# Patient Record
Sex: Male | Born: 2000 | Race: White | Hispanic: No | Marital: Single | Smoking: Never smoker
Health system: Southern US, Community
[De-identification: ages and names within clinical notes are randomized; demographics above are authoritative.]

---
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
## 2022-01-10 ENCOUNTER — Ambulatory Visit
Admission: RE | Admit: 2022-01-10 | Discharge: 2022-01-10 | Disposition: A | Discharge status: Home / Self Care | Payer: BC Managed Care – PPO | Source: Ambulatory Visit | Attending: Gastroenterology | Admitting: Gastroenterology

## 2022-01-10 ENCOUNTER — Ambulatory Visit: Payer: BC Managed Care – PPO | Admitting: Anesthesiology

## 2022-01-10 ENCOUNTER — Encounter: Payer: Self-pay | Admitting: Gastroenterology

## 2022-01-10 ENCOUNTER — Encounter
Admission: RE | Disposition: A | Discharge status: Home / Self Care | Payer: Self-pay | Source: Ambulatory Visit | Attending: Gastroenterology

## 2022-01-10 DIAGNOSIS — K900 Celiac disease: Secondary | ICD-10-CM | POA: Insufficient documentation

## 2022-01-10 DIAGNOSIS — Z8719 Personal history of other diseases of the digestive system: Secondary | ICD-10-CM

## 2022-01-10 HISTORY — PX: ESOPHAGOGASTRODUODENOSCOPY (EGD) WITH PROPOFOL: SHX5813

## 2022-01-10 SURGERY — ESOPHAGOGASTRODUODENOSCOPY (EGD) WITH PROPOFOL
Anesthesia: General

## 2022-01-10 MED ORDER — PROPOFOL 10 MG/ML IV BOLUS
INTRAVENOUS | Status: DC | PRN
Start: 2022-01-10 — End: 2022-01-10
  Administered 2022-01-10: 20 mg via INTRAVENOUS
  Administered 2022-01-10: 50 mg via INTRAVENOUS

## 2022-01-10 MED ORDER — MIDAZOLAM HCL 2 MG/2ML IJ SOLN
INTRAMUSCULAR | Status: DC | PRN
  Administered 2022-01-10: 2 mg via INTRAVENOUS

## 2022-01-10 MED ORDER — LIDOCAINE HCL (CARDIAC) PF 100 MG/5ML IV SOSY
PREFILLED_SYRINGE | INTRAVENOUS | Status: DC | PRN
Start: 2022-01-10 — End: 2022-01-10
  Administered 2022-01-10: 100 mg via INTRAVENOUS

## 2022-01-10 MED ORDER — MIDAZOLAM HCL 2 MG/2ML IJ SOLN
INTRAMUSCULAR | Status: AC
  Filled 2022-01-10: qty 2

## 2022-01-10 MED ORDER — SODIUM CHLORIDE 0.9 % IV SOLN
INTRAVENOUS | Status: DC
  Administered 2022-01-10: 20 mL/h via INTRAVENOUS

## 2022-01-10 MED ORDER — DEXMEDETOMIDINE HCL IN NACL 200 MCG/50ML IV SOLN
INTRAVENOUS | Status: DC | PRN
  Administered 2022-01-10: 12 ug via INTRAVENOUS
  Administered 2022-01-10: 8 ug via INTRAVENOUS

## 2022-01-10 MED ORDER — GLYCOPYRROLATE 0.2 MG/ML IJ SOLN
INTRAMUSCULAR | Status: DC | PRN
  Administered 2022-01-10: .2 mg via INTRAVENOUS

## 2022-01-10 MED ORDER — PROPOFOL 500 MG/50ML IV EMUL
INTRAVENOUS | Status: DC | PRN
  Administered 2022-01-10: 155 ug/kg/min via INTRAVENOUS

## 2022-01-10 NOTE — Anesthesia Postprocedure Evaluation (Signed)
Anesthesia Post Note ? ?Patient: Nicholas Barnes ? ?Procedure(s) Performed: ESOPHAGOGASTRODUODENOSCOPY (EGD) WITH PROPOFOL ? ?Patient location during evaluation: PACU ?Anesthesia Type: General ?Level of consciousness: awake and oriented ?Pain management: pain level controlled ?Vital Signs Assessment: post-procedure vital signs reviewed and stable ?Respiratory status: spontaneous breathing and respiratory function stable ?Cardiovascular status: stable ?Anesthetic complications: no ? ? ?No notable events documented. ? ? ?Last Vitals:  ?Vitals:  ? 01/10/22 0906 01/10/22 0958  ?BP: 128/87 96/65  ?Pulse: 89   ?Resp: 20   ?Temp: (!) 36.2 ?C 36.4 ?C  ?SpO2: 100%   ?  ?Last Pain:  ?Vitals:  ? 01/10/22 1028  ?TempSrc:   ?PainSc: 0-No pain  ? ? ?  ?  ?  ?  ?  ?  ? ?Nicholas Barnes,Nicholas Barnes ? ? ? ? ?

## 2022-01-10 NOTE — Anesthesia Procedure Notes (Signed)
Procedure Name: General with mask airway ?Date/Time: 01/10/2022 9:45 AM ?Performed by: Mohammed Kindle, CRNA ?Pre-anesthesia Checklist: Patient identified, Emergency Drugs available, Suction available and Patient being monitored ?Patient Re-evaluated:Patient Re-evaluated prior to induction ?Oxygen Delivery Method: Simple face mask ?Induction Type: IV induction ?Placement Confirmation: positive ETCO2, CO2 detector and breath sounds checked- equal and bilateral ?Dental Injury: Teeth and Oropharynx as per pre-operative assessment  ? ? ? ? ?

## 2022-01-10 NOTE — Transfer of Care (Signed)
Immediate Anesthesia Transfer of Care Note ? ?Patient: Nicholas Barnes ? ?Procedure(s) Performed: ESOPHAGOGASTRODUODENOSCOPY (EGD) WITH PROPOFOL ? ?Patient Location: Endoscopy Unit ? ?Anesthesia Type:General ? ?Level of Consciousness: drowsy and patient cooperative ? ?Airway & Oxygen Therapy: Patient Spontanous Breathing and Patient connected to face mask oxygen ? ?Post-op Assessment: Report given to RN and Post -op Vital signs reviewed and stable ? ?Post vital signs: Reviewed and stable ? ?Last Vitals:  ?Vitals Value Taken Time  ?BP 96/65 01/10/22 0958  ?Temp 36.4 ?C 01/10/22 0958  ?Pulse 62 01/10/22 1003  ?Resp 22 01/10/22 1003  ?SpO2 100 % 01/10/22 1003  ?Vitals shown include unvalidated device data. ? ?Last Pain:  ?Vitals:  ? 01/10/22 0958  ?TempSrc: Temporal  ?PainSc:   ?   ? ?  ? ?Complications: No notable events documented. ?

## 2022-01-10 NOTE — Op Note (Signed)
[facility] ?Gastroenterology ?Patient Name: Nicholas Barnes ?Procedure Date: 01/10/2022 9:38 AM ?MRN: [id number] ?Account #: [id number] ?Date of Birth: Feb 03, 2001 ?Admit Type: Outpatient ?Age: 21 ?Room: [facility] ENDO ROOM 4 ?Gender: Male ?Note Status: Finalized ?Instrument Name: Upper Endoscope [id number] ?Procedure:             Upper GI endoscopy ?Indications:           Follow-up of celiac disease ?Providers:             Toney Reil MD, MD ?Referring MD:          [facility] ?Medicines:             General Anesthesia ?Complications:         No immediate complications. Estimated blood loss: None. ?Procedure:             Pre-Anesthesia Assessment: ?                       - Prior to the procedure, a History and Physical was  ?                       performed, and patient medications and allergies were  ?                       reviewed. The patient is competent. The risks and  ?                       benefits of the procedure and the sedation options and  ?                       risks were discussed with the patient. All questions  ?                       were answered and informed consent was obtained.  ?                       Patient identification and proposed procedure were  ?                       verified by the physician, the nurse, the  ?                       anesthesiologist, the anesthetist and the technician  ?                       in the pre-procedure area in the procedure room in the  ?                       endoscopy suite. Mental Status Examination: alert and  ?                       oriented. Airway Examination: normal oropharyngeal  ?                       airway and neck mobility. Respiratory Examination:  ?                       clear to auscultation. CV Examination: normal.  ?  Prophylactic Antibiotics: The patient does not require  ?                       prophylactic antibiotics. Prior Anticoagulants: The  ?                       patient has taken no previous  anticoagulant or  ?                       antiplatelet agents. ASA Grade Assessment: II - A  ?                       patient with mild systemic disease. After reviewing  ?                       the risks and benefits, the patient was deemed in  ?                       satisfactory condition to undergo the procedure. The  ?                       anesthesia plan was to use general anesthesia.  ?                       Immediately prior to administration of medications,  ?                       the patient was re-assessed for adequacy to receive  ?                       sedatives. The heart rate, respiratory rate, oxygen  ?                       saturations, blood pressure, adequacy of pulmonary  ?                       ventilation, and response to care were monitored  ?                       throughout the procedure. The physical status of the  ?                       patient was re-assessed after the procedure. ?                       After obtaining informed consent, the endoscope was  ?                       passed under direct vision. Throughout the procedure,  ?                       the patient's blood pressure, pulse, and oxygen  ?                       saturations were monitored continuously. The Endoscope  ?                       was introduced through the mouth, and advanced to the  ?  second part of duodenum. The upper GI endoscopy was  ?                       accomplished without difficulty. The patient tolerated  ?                       the procedure well. ?Findings: ?     The entire examined stomach was normal. ?     Esophagogastric landmarks were identified: the gastroesophageal junction  ?     was found at 40 cm from the incisors. ?     The gastroesophageal junction and examined esophagus were normal. ?     Diffuse mildly scalloped mucosa was found in the second portion of the  ?     duodenum. Biopsies for histology were taken with a cold forceps for  ?     evaluation of celiac  disease. ?Impression:            - Normal stomach. ?                       - Esophagogastric landmarks identified. ?                       - Normal gastroesophageal junction and esophagus. ?                       - Scalloped mucosa was found in the duodenum,  ?                       consistent with celiac disease. Biopsied. ?Recommendation:        - Discharge patient to home (with escort). ?                       - Resume previous diet today. ?                       - Continue present medications. ?                       - Await pathology results. ?Procedure Code(s):     --- Professional --- ?                       [phone], Esophagogastroduodenoscopy, flexible,  ?                       transoral; with biopsy, single or multiple ?Diagnosis Code(s):     --- Professional --- ?                       K90.0, Celiac disease ?CPT copyright 2019 [employer] Medical Association. All rights reserved. ?The codes documented in this report are preliminary and upon coder review may  ?be revised to meet current compliance requirements. ?Dr. Libby Maw ?Carel Carrier Alger Memos MD, MD ?01/10/2022 9:58:00 AM ?This report has been signed electronically. ?Number of Addenda: 0 ?Note Initiated On: 01/10/2022 9:38 AM ?Estimated Blood Loss:  Estimated blood loss: none. Estimated blood loss: none. ?     [facility] ?

## 2022-01-10 NOTE — Anesthesia Preprocedure Evaluation (Signed)
Anesthesia Evaluation  ?Patient identified by MRN, date of birth, ID band ?Patient awake ? ? ? ?Reviewed: ?Allergy & Precautions, NPO status , Patient's Chart, lab work & pertinent test results ? ?Airway ?Mallampati: II ? ?TM Distance: >3 FB ?Neck ROM: full ? ? ? Dental ? ?(+) Teeth Intact ?  ?Pulmonary ?neg pulmonary ROS,  ?  ?Pulmonary exam normal ?breath sounds clear to auscultation ? ? ? ? ? ? Cardiovascular ?Exercise Tolerance: Good ?negative cardio ROS ?Normal cardiovascular exam ?Rhythm:Regular  ? ?  ?Neuro/Psych ?negative neurological ROS ? negative psych ROS  ? GI/Hepatic ?negative GI ROS, Neg liver ROS,   ?Endo/Other  ?negative endocrine ROS ? Renal/GU ?negative Renal ROS  ?negative genitourinary ?  ?Musculoskeletal ?negative musculoskeletal ROS ?(+)  ? Abdominal ?Normal abdominal exam  (+)   ?Peds ?negative pediatric ROS ?(+)  Hematology ?negative hematology ROS ?(+)   ?Anesthesia Other Findings ?No past medical history on file. ? ?No past surgical history on file. ? ?BMI   ? Body Mass Index: 18.17 kg/m?  ?  ? ? Reproductive/Obstetrics ?negative OB ROS ? ?  ? ? ? ? ? ? ? ? ? ? ? ? ? ?  ?  ? ? ? ? ? ? ? ? ?Anesthesia Physical ?Anesthesia Plan ? ?ASA: 2 ? ?Anesthesia Plan: General  ? ?Post-op Pain Management:   ? ?Induction: Intravenous ? ?PONV Risk Score and Plan: Propofol infusion and TIVA ? ?Airway Management Planned: Natural Airway and Nasal Cannula ? ?Additional Equipment:  ? ?Intra-op Plan:  ? ?Post-operative Plan:  ? ?Informed Consent: I have reviewed the patients History and Physical, chart, labs and discussed the procedure including the risks, benefits and alternatives for the proposed anesthesia with the patient or authorized representative who has indicated his/her understanding and acceptance.  ? ? ? ?Dental Advisory Given ? ?Plan Discussed with: CRNA and Surgeon ? ?Anesthesia Plan Comments:   ? ? ? ? ? ? ?Anesthesia Quick Evaluation ? ?

## 2022-01-10 NOTE — H&P (Signed)
?  Arlyss Repress, MD ?[address]  ?[address]  ?[place], [place] [address]  ?Main: [phone]  ?Fax: [phone] ?Pager: [phone] ? ?Primary Care Physician:  [facility] ?Primary Gastroenterologist:  Dr. Arlyss Repress ? ?Pre-Procedure History & Physical: ?HPI:  Nicholas Barnes is a 21 y.o. male is here for an endoscopy. ?  ?No past medical history on file. ? ?No past surgical history on file. ? ?Prior to Admission medications   ?Medication Sig Start Date End Date Taking? Authorizing Provider  ?hydrALAZINE (APRESOLINE) 10 MG tablet Take 10 mg by mouth 3 (three) times daily.   Yes [provider]  ?OLANZapine (ZYPREXA) 10 MG tablet Take 10 mg by mouth at bedtime.   Yes [provider]  ?ondansetron (ZOFRAN-ODT) 4 MG disintegrating tablet Take 1 tablet (4 mg total) by mouth every 8 (eight) hours as needed for up to 30 doses for nausea or vomiting. 08/11/21  Yes Chanse Kagel, Loel Dubonnet, MD  ?SODIUM FLUORIDE, DENTAL RINSE, 0.2 % SOLN Take by mouth. 11/21/21  Yes [provider]  ?Vilazodone HCl 20 MG TABS Take 1 tablet by mouth daily. 11/22/21  Yes [provider]  ?amitriptyline (ELAVIL) 10 MG tablet Take 2 tablets (20 mg total) by mouth at bedtime. 10/18/21 12/20/21  Toney Reil, MD  ? ? ?Allergies as of 12/20/2021  ? (No Known Allergies)  ? ? ?No family history on file. ? ?Social History  ? ?Socioeconomic History  ? Marital status: Single  ?  Spouse name: Not on file  ? Number of children: Not on file  ? Years of education: Not on file  ? Highest education level: Not on file  ?Occupational History  ? Not on file  ?Tobacco Use  ? Smoking status: Never  ? Smokeless tobacco: Never  ?Substance and Sexual Activity  ? Alcohol use: Not on file  ? Drug use: Not on file  ? Sexual activity: Not on file  ?Other Topics Concern  ? Not on file  ?Social History Narrative  ? Not on file  ? ?Social Determinants of Health  ? ?Financial Resource Strain: Not on file  ?Food Insecurity:  Not on file  ?Transportation Needs: Not on file  ?Physical Activity: Not on file  ?Stress: Not on file  ?Social Connections: Not on file  ?Intimate Partner Violence: Not on file  ? ? ?Review of Systems: ?See HPI, otherwise negative ROS ? ?Physical Exam: ?BP 128/87   Pulse 89   Temp (!) 97.2 ?F (36.2 ?C) (Temporal)   Resp 20   Ht 5\' 7"  (1.702 m)   Wt 52.6 kg   SpO2 100%   BMI 18.17 kg/m?  ?General:   Alert,  pleasant and cooperative in NAD ?Head:  Normocephalic and atraumatic. ?Neck:  Supple; no masses or thyromegaly. ?Lungs:  Clear throughout to auscultation.    ?Heart:  Regular rate and rhythm. ?Abdomen:  Soft, nontender and nondistended. Normal bowel sounds, without guarding, and without rebound.   ?Neurologic:  Alert and  oriented x4;  grossly normal neurologically. ? ?Impression/Plan: ?ORVIN NETTER is here for an endoscopy to be performed for follow up celiac disease ? ?Risks, benefits, limitations, and alternatives regarding  endoscopy have been reviewed with the patient.  Questions have been answered.  All parties agreeable. ? ? ?Danella Deis, MD  01/10/2022, 9:35 AM ?

## 2022-01-11 LAB — SURGICAL PATHOLOGY

## 2022-01-15 ENCOUNTER — Other Ambulatory Visit: Payer: Self-pay | Admitting: Gastroenterology

## 2022-01-15 DIAGNOSIS — R109 Unspecified abdominal pain: Secondary | ICD-10-CM

## 2022-01-15 DIAGNOSIS — K900 Celiac disease: Secondary | ICD-10-CM

## 2022-01-23 ENCOUNTER — Other Ambulatory Visit: Payer: Self-pay

## 2022-01-24 ENCOUNTER — Encounter: Payer: Self-pay | Admitting: Gastroenterology

## 2022-01-24 ENCOUNTER — Ambulatory Visit (INDEPENDENT_AMBULATORY_CARE_PROVIDER_SITE_OTHER): Payer: BC Managed Care – PPO | Admitting: Gastroenterology

## 2022-01-24 VITALS — BP 126/84 | HR 106 | Temp 98.4°F | Ht 67.0 in | Wt 115.4 lb

## 2022-01-24 DIAGNOSIS — K900 Celiac disease: Secondary | ICD-10-CM | POA: Diagnosis not present

## 2022-01-24 DIAGNOSIS — R1013 Epigastric pain: Secondary | ICD-10-CM

## 2022-01-24 DIAGNOSIS — G8929 Other chronic pain: Secondary | ICD-10-CM

## 2022-01-24 MED ORDER — BUDESONIDE 3 MG PO CPEP: 9.0000 mg | ORAL_CAPSULE | Freq: Every day | ORAL | 2 refills | Status: DC

## 2022-01-24 NOTE — Progress Notes (Signed)
?  ?Cephas Darby, MD ?[address]  ?[address]  ?[place], [place] [address]  ?Main: [phone]  ?Fax: [phone] ? ? ? ?Gastroenterology Consultation ? ?Referring Provider:     [facility] ?Primary Care Physician:  [facility] ?Primary Gastroenterologist:  Dr. Dola Argyle, [facility] ?Reason for Consultation:     Celiac disease ?      ? HPI:   ?Nicholas Barnes is a 21 y.o. male referred by [facility], [facility]  for consultation & management of celiac disease.  Patient is diagnosed with biopsy-proven celiac disease based on the endoscopy performed by [facility] primary gastroenterologist in 7/22.  History serum TTG IgA levels were also elevated to 44 prior to upper endoscopy.  Nicholas Barnes had been chronically experiencing left-sided upper abdominal pain with very low appetite and chronic nausea, unable to gain weight.  He does struggle with having regular bowel movements as well, was told to take lactulose as needed.  He has tried cyproheptadine to improve appetite which did not help.  He is currently on Zyprexa which he thinks is not benefiting as well.  With the help of his mom, Nicholas Barnes has been following strict gluten-free diet since the diagnosis of celiac disease which has helped improving his constipation.  He does home school because of his ongoing symptoms GI issues.  He does acknowledge that he gets anxious and stressed out with his classes.  He feels like "he is stuck and unable to move forward" with his school.  He also notices that the nausea and pain are both triggered in a short while since beginning of his school.  He hardly manages to eat 1 good meal a day.  Protein shakes such as boost or Ensure make his nausea worse.  He is also trying to follow a lactose-free diet ? ?Review of labs revealed no evidence of anemia, normal ferritin, B12 levels.  Mild vitamin D deficiency.  TSH and free T4 normal, ESR and CRP are normal ? ?Follow-up visit 10/18/2021 ?Patient is here for follow-up of upper  abdominal pain and nausea.  Patient states that amitriptyline 10 mg did not improve his GI symptoms. He has been started on Zyprexa 7.5 mg daily which helped to stabilize his mood as well as gained at least 10 pounds.  He denies having any issues with bowel movements.  Continues to follow strict gluten-free diet ? ?Follow-up visit 12/20/2021 ?Nicholas Barnes is here for follow-up of celiac disease and ongoing GI symptoms.  He reports that his weight has been stable and appetite has been okay, able to manage to eat only 1 meal a day and snacks rest of the day.  He does report ongoing epigastric pain, if he tries to eat more than 1 meal a day, it leads to vomiting.  He continues to take Zyprexa 5 mg at bedtime, amitriptyline, dicyclomine and his psychiatric medications.  He reports having good bowel movement daily if he manages to eat 1 meal a day ? ?Follow-up visit 01/24/2022 ?Patient is here for follow-up of celiac disease.  His most recent TTG IgA levels are slightly above normal.  He is following strict gluten-free diet.  He underwent repeat upper endoscopy to assess celiac disease activity, pathology confirmed Marsh 2B activity.  Patient reports ongoing epigastric pain and nausea triggered after a small meal, his intake has not improved.  His weight has been stable.  Patient mostly eats rice with chicken or steak.  He does not prefer to eat fruits or vegetables. ?Patient is also  taking amitriptyline 30 mg at bedtime with not much improvement in his symptoms.  He reports having regular bowel movements. ? ?Patient is accompanied by his mom today ?He does not smoke or drink alcohol ?Denies any marijuana use or vaping ? ?NSAIDs: None ? ?Antiplts/Anticoagulants/Anti thrombotics: None ? ?GI Procedures:  ?Upper endoscopy 01/10/2022 ?- Normal stomach. ?- Esophagogastric landmarks identified. ?- Normal gastroesophageal junction and esophagus. ?- Scalloped mucosa was found in the duodenum, consistent with celiac disease.  Biopsied. ? ?DIAGNOSIS:  ?A.  DUODENUM; COLD BIOPSY:  ?- DUODENAL MUCOSA WITH PATCHY MILD VILLOUS BLUNTING, CRYPT HYPERPLASIA,  ?AND MILDLY INCREASED INTRAEPITHELIAL LYMPHOCYTES, COMPATIBLE WITH  ?PATIENT'S HISTORY OF CELIAC DISEASE.  ?- NEGATIVE FOR DYSPLASIA AND MALIGNANCY.  ? ?EGD 09/29/2019 at [facility] ? ?Diagnosis    ?A: Stomach, biopsy ?- Gastric fundic type mucosa with no significant histopathologic abnormality ?- No evidence of metaplasia or dysplasia ?  ?B: Small bowel duodenum, biopsy ?- Duodenal mucosa with no significant histopathologic abnormality ?- No increase in intraepithelial lymphocytes or evidence of villous blunting ?- No evidence of malignancy or dysplasa ?  ?C: Esophagus, biopsy ?- Minute fragments of squamous mucosa with rare intraepithelial lymphocytes and no other histopathologic abnormality ?- No intraepithelial eosinophils identified  ?- No evidence of metaplasia or dysplasia  ? ? ?EGD 04/07/2021 ? ?DIAGNOSIS    ?A.  Duodenum, endoscopic biopsy: ?Duodenal mucosa with patchy mild villous blunting, crypt hyperplasia, and mildly increased intraepithelial lymphocytes morphologically compatible with celiac disease. ?  ?B.  Stomach, endoscopic biopsy: ?Gastric oxyntic mucosa with focal mild chronic gastritis. ?No active gastritis is seen. ?Immunohistochemistry for H. pylori (performed block B1) is negative. ?  ?C.  Esophagus, lower third, endoscopic biopsy: ?Esophageal squamous mucosa with no significant pathologic diagnosis. ?No evidence of reflux or eosinophilic esophagitis is seen. ?  ?D.  Esophagus, upper third, endoscopic biopsy: ?Esophageal squamous mucosa with no significant pathologic diagnosis. ?No evidence of reflux or eosinophilic esophagitis is seen.  ? ? ?History reviewed. No pertinent past medical history. ? ?Past Surgical History:  ?Procedure Laterality Date  ? ESOPHAGOGASTRODUODENOSCOPY (EGD) WITH PROPOFOL N/A 01/10/2022  ? Procedure: ESOPHAGOGASTRODUODENOSCOPY (EGD) WITH PROPOFOL;   Surgeon: Lin Landsman, MD;  Location: [facility] ENDOSCOPY;  Service: Gastroenterology;  Laterality: N/A;  ? ? ? ?Current Outpatient Medications:  ?  amitriptyline (ELAVIL) 10 MG tablet, TAKE TWO TABLETS BY MOUTH AT BEDTIME, Disp: 60 tablet, Rfl: 4 ?  budesonide (ENTOCORT EC) 3 MG 24 hr capsule, Take 3 capsules (9 mg total) by mouth daily., Disp: 90 capsule, Rfl: 2 ?  OLANZapine (ZYPREXA) 10 MG tablet, Take 10 mg by mouth at bedtime., Disp: , Rfl:  ?  ondansetron (ZOFRAN-ODT) 4 MG disintegrating tablet, Take 1 tablet (4 mg total) by mouth every 8 (eight) hours as needed for up to 30 doses for nausea or vomiting., Disp: 30 tablet, Rfl: 0 ?  SODIUM FLUORIDE, DENTAL RINSE, 0.2 % SOLN, Take by mouth., Disp: , Rfl:  ?  Vilazodone HCl 20 MG TABS, Take 1 tablet by mouth daily., Disp: , Rfl:  ? ? ?No family history on file.  ? ?Social History  ? ?Tobacco Use  ? Smoking status: Never  ? Smokeless tobacco: Never  ? ? ?Allergies as of 01/24/2022  ? (No Known Allergies)  ? ? ?Review of Systems:    ?All systems reviewed and negative except where noted in HPI. ? ? Physical Exam:  ?BP 126/84 (BP Location: Left Arm, Patient Position: Sitting, Cuff Size: Normal)   Pulse (!) 106  Temp 98.4 ?F (36.9 ?C) (Oral)   Ht _0  (1.702 m)   Wt 115 lb 6 oz (52.3 kg)   BMI 18.07 kg/m?  ?No LMP for male patient. ? ?General:   Alert, thin built, poorly nourished, pleasant and cooperative in NAD ?Head:  Normocephalic and atraumatic. ?Eyes:  Sclera clear, no icterus.   Conjunctiva pink. ?Ears:  Normal auditory acuity. ?Nose:  No deformity, discharge, or lesions. ?Mouth:  No deformity or lesions,oropharynx pink & moist. ?Neck:  Supple; no masses or thyromegaly. ?Lungs:  Respirations even and unlabored.  Clear throughout to auscultation.   No wheezes, crackles, or rhonchi. No acute distress. ?Heart:  Regular rate and rhythm; no murmurs, clicks, rubs, or gallops. ?Abdomen:  Normal bowel sounds. Soft, non-tender and non-distended without masses,  hepatosplenomegaly or hernias noted.  No guarding or rebound tenderness.   ?Rectal: Not performed ?Msk:  Symmetrical without gross deformities. Good, equal movement & strength bilaterally. ?Pulses:  Normal pulses noted. ?Extre

## 2022-07-11 ENCOUNTER — Ambulatory Visit: Payer: Self-pay | Admitting: Gastroenterology

## 2022-07-16 ENCOUNTER — Encounter: Payer: Self-pay | Admitting: Gastroenterology

## 2022-07-16 ENCOUNTER — Ambulatory Visit (INDEPENDENT_AMBULATORY_CARE_PROVIDER_SITE_OTHER): Payer: BC Managed Care – PPO | Admitting: Gastroenterology

## 2022-07-16 ENCOUNTER — Other Ambulatory Visit: Payer: Self-pay

## 2022-07-16 VITALS — BP 124/83 | HR 106 | Temp 99.1°F | Ht 67.0 in | Wt 116.2 lb

## 2022-07-16 DIAGNOSIS — K900 Celiac disease: Secondary | ICD-10-CM | POA: Diagnosis not present

## 2022-07-16 NOTE — Progress Notes (Signed)
Cephas Darby, MD [address]  [place]  [place], [place] [address]  Main: [phone]  Fax: [phone]    Gastroenterology Consultation  Referring Provider:     [facility] Primary Care Physician:  [facility] Primary Gastroenterologist:  Dr. Dola Argyle, [facility] Reason for Consultation:     Celiac disease        HPI:   Nicholas Barnes is a 21 y.o. male referred by [facility], [facility]  for consultation & management of celiac disease.  Patient is diagnosed with biopsy-proven celiac disease based on the endoscopy performed by [facility] primary gastroenterologist in 7/22.  History serum TTG IgA levels were also elevated to 44 prior to upper endoscopy.  Nicholas Barnes had been chronically experiencing left-sided upper abdominal pain with very low appetite and chronic nausea, unable to gain weight.  He does struggle with having regular bowel movements as well, was told to take lactulose as needed.  He has tried cyproheptadine to improve appetite which did not help.  He is currently on Zyprexa which he thinks is not benefiting as well.  With the help of his mom, Nicholas Barnes has been following strict gluten-free diet since the diagnosis of celiac disease which has helped improving his constipation.  He does home school because of his ongoing symptoms GI issues.  He does acknowledge that he gets anxious and stressed out with his classes.  He feels like "he is stuck and unable to move forward" with his school.  He also notices that the nausea and pain are both triggered in a short while since beginning of his school.  He hardly manages to eat 1 good meal a day.  Protein shakes such as boost or Ensure make his nausea worse.  He is also trying to follow a lactose-free diet  Review of labs revealed no evidence of anemia, normal ferritin, B12 levels.  Mild vitamin D deficiency.  TSH and free T4 normal, ESR and CRP are normal  Follow-up visit 10/18/2021 Patient is here for follow-up of upper  abdominal pain and nausea.  Patient states that amitriptyline 10 mg did not improve his GI symptoms. He has been started on Zyprexa 7.5 mg daily which helped to stabilize his mood as well as gained at least 10 pounds.  He denies having any issues with bowel movements.  Continues to follow strict gluten-free diet  Follow-up visit 12/20/2021 Nicholas Barnes is here for follow-up of celiac disease and ongoing GI symptoms.  He reports that his weight has been stable and appetite has been okay, able to manage to eat only 1 meal a day and snacks rest of the day.  He does report ongoing epigastric pain, if he tries to eat more than 1 meal a day, it leads to vomiting.  He continues to take Zyprexa 5 mg at bedtime, amitriptyline, dicyclomine and his psychiatric medications.  He reports having good bowel movement daily if he manages to eat 1 meal a day  Follow-up visit 01/24/2022 Patient is here for follow-up of celiac disease.  His most recent TTG IgA levels are slightly above normal.  He is following strict gluten-free diet.  He underwent repeat upper endoscopy to assess celiac disease activity, pathology confirmed Marsh 2B activity.  Patient reports ongoing epigastric pain and nausea triggered after a small meal, his intake has not improved.  His weight has been stable.  Patient mostly eats rice with chicken or steak.  He does not prefer to eat fruits or vegetables. Patient is also  taking amitriptyline 30 mg at bedtime with not much improvement in his symptoms.  He reports having regular bowel movements.  Patient is accompanied by his mom today He does not smoke or drink alcohol Denies any marijuana use or vaping  NSAIDs: None  Antiplts/Anticoagulants/Anti thrombotics: None  GI Procedures:  Upper endoscopy 01/10/2022 - Normal stomach. - Esophagogastric landmarks identified. - Normal gastroesophageal junction and esophagus. - Scalloped mucosa was found in the duodenum, consistent with celiac disease.  Biopsied.  DIAGNOSIS:  A.  DUODENUM; COLD BIOPSY:  - DUODENAL MUCOSA WITH PATCHY MILD VILLOUS BLUNTING, CRYPT HYPERPLASIA,  AND MILDLY INCREASED INTRAEPITHELIAL LYMPHOCYTES, COMPATIBLE WITH  PATIENT'S HISTORY OF CELIAC DISEASE.  - NEGATIVE FOR DYSPLASIA AND MALIGNANCY.   EGD 09/29/2019 at [facility]  Diagnosis    A: Stomach, biopsy - Gastric fundic type mucosa with no significant histopathologic abnormality - No evidence of metaplasia or dysplasia   B: Small bowel duodenum, biopsy - Duodenal mucosa with no significant histopathologic abnormality - No increase in intraepithelial lymphocytes or evidence of villous blunting - No evidence of malignancy or dysplasa   C: Esophagus, biopsy - Minute fragments of squamous mucosa with rare intraepithelial lymphocytes and no other histopathologic abnormality - No intraepithelial eosinophils identified  - No evidence of metaplasia or dysplasia    EGD 04/07/2021  DIAGNOSIS    A.  Duodenum, endoscopic biopsy: Duodenal mucosa with patchy mild villous blunting, crypt hyperplasia, and mildly increased intraepithelial lymphocytes morphologically compatible with celiac disease.   B.  Stomach, endoscopic biopsy: Gastric oxyntic mucosa with focal mild chronic gastritis. No active gastritis is seen. Immunohistochemistry for H. pylori (performed block B1) is negative.   C.  Esophagus, lower third, endoscopic biopsy: Esophageal squamous mucosa with no significant pathologic diagnosis. No evidence of reflux or eosinophilic esophagitis is seen.   D.  Esophagus, upper third, endoscopic biopsy: Esophageal squamous mucosa with no significant pathologic diagnosis. No evidence of reflux or eosinophilic esophagitis is seen.    History reviewed. No pertinent past medical history.  Past Surgical History:  Procedure Laterality Date   ESOPHAGOGASTRODUODENOSCOPY (EGD) WITH PROPOFOL N/A 01/10/2022   Procedure: ESOPHAGOGASTRODUODENOSCOPY (EGD) WITH PROPOFOL;   Surgeon: Lin Landsman, MD;  Location: [facility] ENDOSCOPY;  Service: Gastroenterology;  Laterality: N/A;     Current Outpatient Medications:    amitriptyline (ELAVIL) 75 MG tablet, Take 75 mg by mouth at bedtime., Disp: , Rfl:    OLANZapine (ZYPREXA) 5 MG tablet, Take 5 mg by mouth at bedtime., Disp: , Rfl:    ondansetron (ZOFRAN-ODT) 4 MG disintegrating tablet, Take 1 tablet (4 mg total) by mouth every 8 (eight) hours as needed for up to 30 doses for nausea or vomiting., Disp: 30 tablet, Rfl: 0   Vilazodone HCl 20 MG TABS, Take 1 tablet by mouth daily., Disp: , Rfl:    History reviewed. No pertinent family history.   Social History   Tobacco Use   Smoking status: Never   Smokeless tobacco: Never  Substance Use Topics   Alcohol use: Never   Drug use: Never    Allergies as of 07/16/2022   (No Known Allergies)    Review of Systems:    All systems reviewed and negative except where noted in HPI.   Physical Exam:  BP 124/83 (BP Location: Left Arm, Patient Position: Sitting, Cuff Size: Normal)   Pulse (!) 106   Temp 99.1 F (37.3 C) (Oral)   Ht _0  (1.702 m)   Wt 116 lb 4 oz (52.7 kg)  BMI 18.21 kg/m  No LMP for male patient.  General:   Alert, thin built, poorly nourished, pleasant and cooperative in NAD Head:  Normocephalic and atraumatic. Eyes:  Sclera clear, no icterus.   Conjunctiva pink. Ears:  Normal auditory acuity. Nose:  No deformity, discharge, or lesions. Mouth:  No deformity or lesions,oropharynx pink & moist. Neck:  Supple; no masses or thyromegaly. Lungs:  Respirations even and unlabored.  Clear throughout to auscultation.   No wheezes, crackles, or rhonchi. No acute distress. Heart:  Regular rate and rhythm; no murmurs, clicks, rubs, or gallops. Abdomen:  Normal bowel sounds. Soft, non-tender and non-distended without masses, hepatosplenomegaly or hernias noted.  No guarding or rebound tenderness.   Rectal: Not performed Msk:  Symmetrical without  gross deformities. Good, equal movement & strength bilaterally. Pulses:  Normal pulses noted. Extremities:  No clubbing or edema.  No cyanosis. Neurologic:  Alert and oriented x3;  grossly normal neurologically. Skin:  Intact without significant lesions or rashes. No jaundice. Lymph Nodes:  No significant cervical adenopathy. Psych:  Alert and cooperative. Normal mood and affect.  Imaging Studies: Reviewed Gastric emptying study on 03/03/2021 normal  Assessment and Plan:   Nicholas Barnes is a 21 y.o. male with BMI 17 is seen in consultation for diagnosis of celiac disease, biopsy-proven, elevated TTG IgA levels, upper abdominal pain and nausea with poor appetite and unable to gain weight  Celiac disease: Biopsy-proven Continue strict gluten-free diet Celiac serologies from 12/2021 revealed mildly elevated TTG IgA levels, repeat EGD revealed active celiac disease with persistent Marsh 2 histology Recommend to seek televisit consultation with celiac expert dietitian to evaluate unintentional gluten exposure/cross-contamination Discussed about 3 months trial of budesonide 9 mg daily and patient is agreeable Patient may also have a component of functional dyspepsia   Upper abdominal pain with nausea Gastric emptying study normal in 02/2021 Continue amitriptyline 30 mg at bedtime   Weight loss has stabilized Patient is currently on Zyprexa and amitriptyline which has helped to improve appetite and gain weight   Follow up in 6 months   Cephas Darby, MD

## 2022-07-17 ENCOUNTER — Encounter: Payer: Self-pay | Admitting: Gastroenterology

## 2022-07-18 LAB — CELIAC DISEASE PANEL
Endomysial IgA: NEGATIVE
IgA/Immunoglobulin A, Serum: 151 mg/dL (ref 90–386)
Transglutaminase IgA: 3 U/mL (ref 0–3)

## 2022-08-22 ENCOUNTER — Encounter: Payer: Self-pay | Admitting: Gastroenterology

## 2022-08-24 MED ORDER — DICYCLOMINE HCL 10 MG PO CAPS: 10.0000 mg | ORAL_CAPSULE | Freq: Four times a day (QID) | ORAL | 0 refills | Status: AC | PRN

## 2023-05-29 ENCOUNTER — Other Ambulatory Visit: Payer: Self-pay | Admitting: Neurology

## 2023-05-29 DIAGNOSIS — R200 Anesthesia of skin: Secondary | ICD-10-CM

## 2023-05-29 DIAGNOSIS — R42 Dizziness and giddiness: Secondary | ICD-10-CM

## 2023-06-12 ENCOUNTER — Ambulatory Visit
Admission: RE | Admit: 2023-06-12 | Discharge: 2023-06-12 | Disposition: A | Discharge status: Home / Self Care | Payer: BC Managed Care – PPO | Source: Ambulatory Visit | Attending: Neurology | Admitting: Neurology

## 2023-06-12 DIAGNOSIS — R42 Dizziness and giddiness: Secondary | ICD-10-CM

## 2023-06-12 DIAGNOSIS — R200 Anesthesia of skin: Secondary | ICD-10-CM

## 2023-11-25 ENCOUNTER — Emergency Department

## 2023-11-25 ENCOUNTER — Encounter: Payer: Self-pay | Admitting: Emergency Medicine

## 2023-11-25 ENCOUNTER — Other Ambulatory Visit: Payer: Self-pay

## 2023-11-25 ENCOUNTER — Emergency Department
Admission: EM | Admit: 2023-11-25 | Discharge: 2023-11-25 | Disposition: A | Discharge status: Home / Self Care | Attending: Emergency Medicine | Admitting: Emergency Medicine

## 2023-11-25 DIAGNOSIS — W19XXXA Unspecified fall, initial encounter: Secondary | ICD-10-CM | POA: Insufficient documentation

## 2023-11-25 DIAGNOSIS — R29898 Other symptoms and signs involving the musculoskeletal system: Secondary | ICD-10-CM | POA: Insufficient documentation

## 2023-11-25 DIAGNOSIS — M549 Dorsalgia, unspecified: Secondary | ICD-10-CM | POA: Diagnosis present

## 2023-11-25 NOTE — ED Triage Notes (Signed)
 Pt here after a fall and back pain. Pt states his legs gave out and he fell going up the stairs and fell going down the stairs. Pt denies LOC but states he hit his head. Pt c/o back pain, more right sided.

## 2023-11-25 NOTE — ED Provider Notes (Signed)
 [facility] Emergency Department Provider Note     Event Date/Time   First MD Initiated Contact with Patient 11/25/23 [phone]     (approximate)   History   Fall and Back Pain   HPI  Nicholas Barnes is a 23 y.o. male with a history of celiac disease presents to the ED for evaluation of a fall sustaining lower right sided back pain.  Patient reports he was walking up the steps when he felt his legs give out.  He was able to get up and reports he was walking down the steps and his legs gave out again and he slid down the steps hitting his head. No LOC.  Patient reports he has been having his legs give out intermittently for the past year.  He is following neuro for this.  Mother at bedside reports weakness has increased within the past year and is unable to receive answers for the cause.  Patient denies numbness and tingling.  Chart review patient was seen by neurology Dr. Sherryll Burger for lower extremity numbness tingling episode vision loss and lightheadedness.  Unremarkable noncontrast MRI.  Normal electrodiagnostic study of both lower limbs.     Physical Exam   Triage Vital Signs: ED Triage Vitals  Encounter Vitals Group     BP 11/25/23 1144 111/81     Systolic BP Percentile --      Diastolic BP Percentile --      Pulse Rate 11/25/23 1144 78     Resp 11/25/23 1144 17     Temp 11/25/23 1144 98.7 F (37.1 C)     Temp Source 11/25/23 1144 Oral     SpO2 11/25/23 1144 97 %     Weight 11/25/23 1144 115 lb (52.2 kg)     Height 11/25/23 1144 5\' 8"  (1.727 m)     Head Circumference --      Peak Flow --      Pain Score 11/25/23 1149 6     Pain Loc --      Pain Education --      Exclude from Growth Chart --     Most recent vital signs: Vitals:   11/25/23 1144 11/25/23 1548  BP: 111/81 110/80  Pulse: 78 80  Resp: 17 16  Temp: 98.7 F (37.1 C)   SpO2: 97% 98%    General: Alert and oriented. INAD.  Skin:  Warm, dry and intact. No rashes or lesions noted.      Head:  NCAT.  Eyes:  PERRLA. EOMI.  Ears:  No postauricular ecchymosis.  Neck:   No cervical spine tenderness to palpation. Full ROM without difficulty.  CV:  Good peripheral perfusion. RRR.   RESP:  Normal effort. LCTAB.  BACK:  Spinous process is midline without deformity or tenderness.  Tenderness over right paraspinal muscle and lumbar region. MSK:   Full ROM in all joints. No swelling, deformity or tenderness.  NEURO: Cranial nerves II-XII intact. No focal deficits. Sensation and motor function intact. 5/5 muscle strength of UE & LE.  Smooth coordination.  Gait is steady, however at times patient will fall towards his left side into the wall.   ED Results / Procedures / Treatments   Labs (all labs ordered are listed, but only abnormal results are displayed) Labs Reviewed - No data to display  RADIOLOGY  I personally viewed and evaluated these images as part of my medical decision making, as well as reviewing the written report by the radiologist.  ED Provider Interpretation: CT head is normal.  CT cervical spine normal.  CT HEAD WO CONTRAST ( ) Result Date: 11/25/2023 CLINICAL DATA:  Fall, legs gave out while going up stairs, fell down stairs, head trauma. EXAM: CT HEAD WITHOUT CONTRAST CT CERVICAL SPINE WITHOUT CONTRAST TECHNIQUE: Multidetector CT imaging of the head and cervical spine was performed following the standard protocol without intravenous contrast. Multiplanar CT image reconstructions of the cervical spine were also generated. RADIATION DOSE REDUCTION: This exam was performed according to the departmental dose-optimization program which includes automated exposure control, adjustment of the mA and/or kV according to patient size and/or use of iterative reconstruction technique. COMPARISON:  MRI head 06/12/2023 FINDINGS: CT HEAD FINDINGS Brain: No acute intracranial hemorrhage. No CT evidence of acute infarct. No edema, mass effect, or midline shift. The basilar cisterns  are patent. Ventricles: The ventricles are normal. Vascular: No hyperdense vessel or unexpected calcification. Skull: No acute or aggressive finding. Orbits: Orbits are symmetric. Sinuses: The visualized paranasal sinuses are clear. Other: Mastoid air cells are clear. CT CERVICAL SPINE FINDINGS Alignment: Straightening and slight reversal of the normal cervical lordosis. No listhesis. No facet subluxation or dislocation. Skull base and vertebrae: No acute fracture or suspicious osseous lesion. Soft tissues and spinal canal: No prevertebral fluid or swelling. No visible canal hematoma. Disc levels: Intervertebral disc spaces are maintained. No significant spinal canal stenosis or foraminal stenosis. Upper chest: Negative. Other: None. IMPRESSION: No CT evidence of acute intracranial abnormality. No acute fracture or traumatic malalignment of the cervical spine. Electronically Signed   By: Emily Filbert M.D.   On: 11/25/2023 13:19   CT Cervical Spine Wo Contrast Result Date: 11/25/2023 CLINICAL DATA:  Fall, legs gave out while going up stairs, fell down stairs, head trauma. EXAM: CT HEAD WITHOUT CONTRAST CT CERVICAL SPINE WITHOUT CONTRAST TECHNIQUE: Multidetector CT imaging of the head and cervical spine was performed following the standard protocol without intravenous contrast. Multiplanar CT image reconstructions of the cervical spine were also generated. RADIATION DOSE REDUCTION: This exam was performed according to the departmental dose-optimization program which includes automated exposure control, adjustment of the mA and/or kV according to patient size and/or use of iterative reconstruction technique. COMPARISON:  MRI head 06/12/2023 FINDINGS: CT HEAD FINDINGS Brain: No acute intracranial hemorrhage. No CT evidence of acute infarct. No edema, mass effect, or midline shift. The basilar cisterns are patent. Ventricles: The ventricles are normal. Vascular: No hyperdense vessel or unexpected calcification.  Skull: No acute or aggressive finding. Orbits: Orbits are symmetric. Sinuses: The visualized paranasal sinuses are clear. Other: Mastoid air cells are clear. CT CERVICAL SPINE FINDINGS Alignment: Straightening and slight reversal of the normal cervical lordosis. No listhesis. No facet subluxation or dislocation. Skull base and vertebrae: No acute fracture or suspicious osseous lesion. Soft tissues and spinal canal: No prevertebral fluid or swelling. No visible canal hematoma. Disc levels: Intervertebral disc spaces are maintained. No significant spinal canal stenosis or foraminal stenosis. Upper chest: Negative. Other: None. IMPRESSION: No CT evidence of acute intracranial abnormality. No acute fracture or traumatic malalignment of the cervical spine. Electronically Signed   By: Emily Filbert M.D.   On: 11/25/2023 13:19    PROCEDURES:  Critical Care performed: No  Procedures   MEDICATIONS ORDERED IN ED: Medications - No data to display   IMPRESSION / MDM / ASSESSMENT AND PLAN / ED COURSE  I reviewed the triage vital signs and the nursing notes.  23 y.o. male presents to the emergency department for evaluation and treatment of lower extremity weakness. See HPI for further details.   Differential diagnosis includes, but is not limited to ICH, intracranial pathology, skull fracture, MSK injury  Patient's presentation is most consistent with acute complicated illness / injury requiring diagnostic workup.  Patient is alert and oriented.  He is hemodynamic stable.  No focal neurodeficits on exam.  Neuroexam is overall reassuring.  Unsure if falling over during gait is intentional or not.  It is very hard to tell. Otherwise physical exam findings are benign.  Head CT and cervical spine CT obtained in triage and is reassuring.  Extensive chart reviewed.  Patient has been having similar symptoms for the past year and a half.  Given that there is no neural deficit I  have advised patient close follow-up with neurology for further evaluation of what mother is concerned of a deconditioning of the patient's lower extremity weakness.  Patient verbalized agreement with this care plan.  He is in stable condition at discharge.  ED return precautions discussed thoroughly.   FINAL CLINICAL IMPRESSION(S) / ED DIAGNOSES   Final diagnoses:  Fall, initial encounter  Right leg weakness   Rx / DC Orders   ED Discharge Orders     None        Note:  This document was prepared using Dragon voice recognition software and may include unintentional dictation errors.    Romeo Apple, Aydeen Blume A, PA-C 11/25/23 2010    Sharman Cheek, MD 11/25/23 2221

## 2023-11-25 NOTE — ED Notes (Signed)
 See triage note Presents s/p falls times 2  Mom states he has been dizzy and weak for about 1 1/2 years But he feels weaker   States his leg gave out  He fell going up the stairs and the again coming down the stairs  Grabbed the railing   hitting his right side and head Denies any fever or n/v

## 2023-11-25 NOTE — Discharge Instructions (Signed)
 Please follow up with your Neurologist for further evaluation.  Your head CT and cervical spine were normal during today's visit.  Your EKG was also normal.

## 2024-01-16 ENCOUNTER — Other Ambulatory Visit: Payer: Self-pay | Admitting: Neurology

## 2024-01-16 DIAGNOSIS — G114 Hereditary spastic paraplegia: Secondary | ICD-10-CM

## 2024-01-28 ENCOUNTER — Other Ambulatory Visit: Payer: Self-pay | Admitting: Neurology

## 2024-01-28 DIAGNOSIS — G114 Hereditary spastic paraplegia: Secondary | ICD-10-CM

## 2024-01-29 ENCOUNTER — Ambulatory Visit
Admission: RE | Admit: 2024-01-29 | Discharge: 2024-01-29 | Disposition: A | Discharge status: Home / Self Care | Source: Ambulatory Visit | Attending: Neurology | Admitting: Neurology

## 2024-01-29 DIAGNOSIS — G114 Hereditary spastic paraplegia: Secondary | ICD-10-CM | POA: Insufficient documentation

## 2024-02-01 ENCOUNTER — Ambulatory Visit
Admission: RE | Admit: 2024-02-01 | Discharge: 2024-02-01 | Disposition: A | Discharge status: Home / Self Care | Source: Ambulatory Visit | Attending: Neurology | Admitting: Neurology

## 2024-02-01 DIAGNOSIS — G114 Hereditary spastic paraplegia: Secondary | ICD-10-CM | POA: Insufficient documentation
